# Patient Record
Sex: Female | Born: 2000 | Race: Black or African American | Hispanic: No | Marital: Single | Smoking: Never smoker
Health system: Southern US, Community
[De-identification: ages and names within clinical notes are randomized; demographics above are authoritative.]

## PROBLEM LIST (undated history)

## (undated) DIAGNOSIS — F419 Anxiety disorder, unspecified: Secondary | ICD-10-CM

---
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
## 2021-02-08 ENCOUNTER — Emergency Department (HOSPITAL_COMMUNITY)
Admission: EM | Admit: 2021-02-08 | Discharge: 2021-02-09 | Disposition: A | Discharge status: Home / Self Care | Payer: Medicaid Other | Attending: Emergency Medicine | Admitting: Emergency Medicine

## 2021-02-08 ENCOUNTER — Other Ambulatory Visit: Payer: Self-pay

## 2021-02-08 ENCOUNTER — Encounter (HOSPITAL_COMMUNITY): Payer: Self-pay

## 2021-02-08 ENCOUNTER — Emergency Department (HOSPITAL_COMMUNITY): Payer: Medicaid Other

## 2021-02-08 DIAGNOSIS — M542 Cervicalgia: Secondary | ICD-10-CM | POA: Insufficient documentation

## 2021-02-08 DIAGNOSIS — S20312A Abrasion of left front wall of thorax, initial encounter: Secondary | ICD-10-CM | POA: Insufficient documentation

## 2021-02-08 DIAGNOSIS — Z23 Encounter for immunization: Secondary | ICD-10-CM | POA: Insufficient documentation

## 2021-02-08 DIAGNOSIS — Y92410 Unspecified street and highway as the place of occurrence of the external cause: Secondary | ICD-10-CM | POA: Insufficient documentation

## 2021-02-08 DIAGNOSIS — S299XXA Unspecified injury of thorax, initial encounter: Secondary | ICD-10-CM | POA: Diagnosis present
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
## 2021-02-09 ENCOUNTER — Emergency Department (HOSPITAL_COMMUNITY): Payer: Medicaid Other

## 2021-02-09 ENCOUNTER — Encounter (HOSPITAL_COMMUNITY): Payer: Self-pay

## 2021-02-09 LAB — BASIC METABOLIC PANEL
Anion gap: 8 (ref 5–15)
BUN: 9 mg/dL (ref 6–20)
CO2: 24 mmol/L (ref 22–32)
Calcium: 9.6 mg/dL (ref 8.9–10.3)
Chloride: 107 mmol/L (ref 98–111)
Creatinine, Ser: 0.45 mg/dL (ref 0.44–1.00)
GFR, Estimated: >60 mL/min (ref >60)
Glucose, Bld: 95 mg/dL (ref 70–99)
Potassium: 3.7 mmol/L (ref 3.5–5.1)
Sodium: 139 mmol/L (ref 135–145)

## 2021-02-09 LAB — CBC
HCT: 37.2 % (ref 36.0–46.0)
Hemoglobin: 12.4 g/dL (ref 12.0–15.0)
MCH: 30 pg (ref 26.0–34.0)
MCHC: 33.3 g/dL (ref 30.0–36.0)
MCV: 89.9 fL (ref 80.0–100.0)
Platelets: 327 10*3/uL (ref 150–400)
RBC: 4.14 MIL/uL (ref 3.87–5.11)
RDW: 12.2 % (ref 11.5–15.5)
WBC: 8.8 10*3/uL (ref 4.0–10.5)
nRBC: 0 % (ref 0.0–0.2)

## 2021-02-09 LAB — I-STAT BETA HCG BLOOD, ED (MC, WL, AP ONLY): I-stat hCG, quantitative: <5 m[IU]/mL (ref <5)

## 2021-02-09 MED ORDER — OXYCODONE-ACETAMINOPHEN 5-325 MG PO TABS
1.0000 | ORAL_TABLET | Freq: Once | ORAL | Status: AC
  Administered 2021-02-09: 1 via ORAL
  Filled 2021-02-09: qty 1

## 2021-02-09 MED ORDER — METHOCARBAMOL 500 MG PO TABS: 500.0000 mg | ORAL_TABLET | Freq: Two times a day (BID) | ORAL | 0 refills | Status: DC

## 2021-02-09 MED ORDER — IOHEXOL 350 MG/ML SOLN
60.0000 mL | Freq: Once | INTRAVENOUS | Status: AC | PRN
  Administered 2021-02-09: 60 mL via INTRAVENOUS

## 2021-02-09 MED ORDER — TETANUS-DIPHTH-ACELL PERTUSSIS 5-2.5-18.5 LF-MCG/0.5 IM SUSY
0.5000 mL | PREFILLED_SYRINGE | Freq: Once | INTRAMUSCULAR | Status: AC
  Administered 2021-02-09: 0.5 mL via INTRAMUSCULAR
  Filled 2021-02-09: qty 0.5
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
## 2021-04-21 ENCOUNTER — Emergency Department (HOSPITAL_COMMUNITY): Payer: Medicaid Other

## 2021-04-21 ENCOUNTER — Other Ambulatory Visit: Payer: Self-pay

## 2021-04-21 ENCOUNTER — Encounter (HOSPITAL_COMMUNITY): Payer: Self-pay | Admitting: Emergency Medicine

## 2021-04-21 ENCOUNTER — Emergency Department (HOSPITAL_COMMUNITY)
Admission: EM | Admit: 2021-04-21 | Discharge: 2021-04-22 | Disposition: A | Discharge status: Home / Self Care | Payer: Medicaid Other | Attending: Emergency Medicine | Admitting: Emergency Medicine

## 2021-04-21 DIAGNOSIS — R0789 Other chest pain: Secondary | ICD-10-CM | POA: Insufficient documentation

## 2021-04-21 DIAGNOSIS — R0602 Shortness of breath: Secondary | ICD-10-CM | POA: Diagnosis not present

## 2021-04-21 DIAGNOSIS — R079 Chest pain, unspecified: Secondary | ICD-10-CM

## 2021-04-21 LAB — CBC WITH DIFFERENTIAL/PLATELET
Abs Immature Granulocytes: 0.01 10*3/uL (ref 0.00–0.07)
Basophils Absolute: 0 10*3/uL (ref 0.0–0.1)
Basophils Relative: 0 %
Eosinophils Absolute: 0.1 10*3/uL (ref 0.0–0.5)
Eosinophils Relative: 2 %
HCT: 37.8 % (ref 36.0–46.0)
Hemoglobin: 12.5 g/dL (ref 12.0–15.0)
Immature Granulocytes: 0 %
Lymphocytes Relative: 40 %
Lymphs Abs: 2.4 10*3/uL (ref 0.7–4.0)
MCH: 30 pg (ref 26.0–34.0)
MCHC: 33.1 g/dL (ref 30.0–36.0)
MCV: 90.9 fL (ref 80.0–100.0)
Monocytes Absolute: 0.5 10*3/uL (ref 0.1–1.0)
Monocytes Relative: 8 %
Neutro Abs: 2.9 10*3/uL (ref 1.7–7.7)
Neutrophils Relative %: 50 %
Platelets: 296 10*3/uL (ref 150–400)
RBC: 4.16 MIL/uL (ref 3.87–5.11)
RDW: 12 % (ref 11.5–15.5)
WBC: 5.9 10*3/uL (ref 4.0–10.5)
nRBC: 0 % (ref 0.0–0.2)

## 2021-04-21 LAB — I-STAT BETA HCG BLOOD, ED (MC, WL, AP ONLY): I-stat hCG, quantitative: <5 m[IU]/mL (ref <5)

## 2021-04-21 LAB — TROPONIN I (HIGH SENSITIVITY): Troponin I (High Sensitivity): <2 ng/L (ref <18)

## 2021-04-21 LAB — BASIC METABOLIC PANEL
Anion gap: 9 (ref 5–15)
BUN: 7 mg/dL (ref 6–20)
CO2: 24 mmol/L (ref 22–32)
Calcium: 9.5 mg/dL (ref 8.9–10.3)
Chloride: 104 mmol/L (ref 98–111)
Creatinine, Ser: 0.59 mg/dL (ref 0.44–1.00)
GFR, Estimated: >60 mL/min (ref >60)
Glucose, Bld: 93 mg/dL (ref 70–99)
Potassium: 4.2 mmol/L (ref 3.5–5.1)
Sodium: 137 mmol/L (ref 135–145)

## 2021-04-21 LAB — D-DIMER, QUANTITATIVE: D-Dimer, Quant: <0.27 ug/mL-FEU (ref 0.00–0.50)

## 2021-04-21 NOTE — ED Provider Notes (Signed)
Emergency Medicine Provider Triage Evaluation Note  Caitlyn Hancock , a 20 y.o. female  was evaluated in triage.  Pt complains of sudden onset, intermittent, left-sided chest pain for the past 3 to 4 days.  Patient reports associated shortness of breath when the chest pain comes on.  It would last several hours before dissipating.  It is sharp in nature.  She denies any other associated symptoms.  Denies any fevers, coughs, body aches recently.  She denies history of DVT or PE.  No recent prolonged travel or immobilization.  No exogenous hormone use.  No active malignancy.  No hemoptysis.  Review of Systems  Positive: + chest pain, SOB Negative: - fevers, cough, nausea, vomiting  Physical Exam  BP (!) 141/93 (BP Location: Right Arm)   Pulse (!) 106   Temp 99 F (37.2 C) (Oral)   Resp 18   SpO2 100%  Gen:   Awake, no distress   Resp:  Normal effort  MSK:   Moves extremities without difficulty  Other:  RRR  Medical Decision Making  Medically screening exam initiated at 8:38 PM.  Appropriate orders placed.  Caitlyn Hancock was informed that the remainder of the evaluation will be completed by another provider, this initial triage assessment does not replace that evaluation, and the importance of remaining in the ED until their evaluation is complete.     Tanda Rockers, PA-C 04/21/21 2040    Ernie Avena, MD 04/21/21 2050

## 2021-04-21 NOTE — ED Triage Notes (Signed)
Pt c/o intermittent left sided chest pain and shortness of breath x 2 days. Denies fever/cough.

## 2021-04-22 LAB — TROPONIN I (HIGH SENSITIVITY): Troponin I (High Sensitivity): <2 ng/L (ref <18)

## 2021-04-22 MED ORDER — ONDANSETRON 4 MG PO TBDP: 4.0000 mg | ORAL_TABLET | Freq: Three times a day (TID) | ORAL | 0 refills | Status: DC | PRN

## 2021-04-22 NOTE — Discharge Instructions (Addendum)
As we discussed your workup today did not show any signs of damage to your heart or lungs, and blood clots or other explanation for your chest pain.  As we discussed there is a large differential for things that can cause chest pain not related to the heart or lungs including acid reflux, anxiety, stress, musculoskeletal pain.  Recommend ibuprofen, Tylenol as needed for chest pain.  If it worsens or fails to improve I do recommend that you follow-up with her primary care doctor and establish care.  If chest pain shortness of breath significantly changes or becomes unbearable please return to the emergency department for further evaluation

## 2021-04-22 NOTE — ED Provider Notes (Signed)
[facility] EMERGENCY DEPARTMENT Provider Note   CSN: [id number] Arrival date & time: 04/21/21  2032     History Chief Complaint  Patient presents with   Chest Pain    Caitlyn Hancock is a 20 y.o. female With no significant past medical history who presents with 3 to 4 days of sudden onset, intermittent left-sided chest pain.  Patient reports some associated shortness of breath.  Patient reports that chest pain lasts for a few hours before dissipating.  Patient reports chest pain is sharp, not crushing.  Patient denies any fever, cough, body aches.  No history of DVT or PE.  No recent prolonged travel.  No use of estrogen or other hormones.  Patient denies history of cancer or hemoptysis.   Chest Pain Associated symptoms: shortness of breath       History reviewed. No pertinent past medical history.  There are no problems to display for this patient.   History reviewed. No pertinent surgical history.   OB History   No obstetric history on file.     No family history on file.     Home Medications Prior to Admission medications   Medication Sig Start Date End Date Taking? Authorizing Provider  ondansetron (ZOFRAN ODT) 4 MG disintegrating tablet Take 1 tablet (4 mg total) by mouth every 8 (eight) hours as needed for nausea or vomiting. 04/22/21  Yes Loel Betancur H, PA-C  methocarbamol (ROBAXIN) 500 MG tablet Take 1 tablet (500 mg total) by mouth 2 (two) times daily. 02/09/21   McDonald, Mia A, PA-C    Allergies    Patient has no known allergies.  Review of Systems   Review of Systems  Respiratory:  Positive for shortness of breath.   Cardiovascular:  Positive for chest pain.  All other systems reviewed and are negative.  Physical Exam Updated Vital Signs BP 123/76   Pulse 91   Temp 98.4 F (36.9 C) (Oral)   Resp 14   SpO2 100%   Physical Exam Vitals and nursing note reviewed.  Constitutional:      General: She is not in acute  distress.    Appearance: Normal appearance.  HENT:     Head: Normocephalic and atraumatic.  Eyes:     General:        Right eye: No discharge.        Left eye: No discharge.  Cardiovascular:     Rate and Rhythm: Normal rate and regular rhythm.     Heart sounds: No murmur heard.   No friction rub. No gallop.     Comments: Initial vitals show patient with tachycardia, however no tachycardia on my evaluation.  No tenderness to palpation chest wall, or upper abdomen. Pulmonary:     Effort: Pulmonary effort is normal.     Breath sounds: Normal breath sounds.  Abdominal:     General: Bowel sounds are normal.     Palpations: Abdomen is soft.  Skin:    General: Skin is warm and dry.     Capillary Refill: Capillary refill takes less than 2 seconds.  Neurological:     Mental Status: She is alert and oriented to person, place, and time.  Psychiatric:        Mood and Affect: Mood normal.        Behavior: Behavior normal.    ED Results / Procedures / Treatments   Labs (all labs ordered are listed, but only abnormal results are displayed) Labs Reviewed  BASIC METABOLIC  PANEL  CBC WITH DIFFERENTIAL/PLATELET  D-DIMER, QUANTITATIVE  I-STAT BETA HCG BLOOD, ED ([facility], [facility], [facility] ONLY)  TROPONIN I (HIGH SENSITIVITY)  TROPONIN I (HIGH SENSITIVITY)    EKG None  Radiology DG Chest 2 View  Result Date: 04/21/2021 CLINICAL DATA:  Chest pain. EXAM: CHEST - 2 VIEW COMPARISON:  Radiograph and CT 02/09/2021 FINDINGS: The cardiomediastinal contours are normal. The lungs are clear. Pulmonary vasculature is normal. No consolidation, pleural effusion, or pneumothorax. No acute osseous abnormalities are seen. IMPRESSION: Negative radiographs of the chest. Electronically Signed   By: Narda Rutherford M.D.   On: 04/21/2021 21:48    Procedures Procedures   Medications Ordered in ED Medications - No data to display  ED Course  I have reviewed the triage vital signs and the nursing notes.  Pertinent  labs & imaging results that were available during my care of the patient were reviewed by me and considered in my medical decision making (see chart for details).    MDM Rules/Calculators/A&P                         Given the large differential diagnosis for [facility], the decision making in this case is of high complexity.  After evaluating all of the data points in this case, the presentation of Caitlyn Hancock is NOT consistent with Acute Coronary Syndrome (ACS) and/or myocardial ischemia, pulmonary embolism, aortic dissection; Borhaave's, significant arrythmia, pneumothorax, cardiac tamponade, or other emergent cardiopulmonary condition.  Further, the presentation of Caitlyn Hancock is NOT consistent with pericarditis, myocarditis, cholecystitis, pancreatitis, mediastinitis, endocarditis, new valvular disease.  Additionally, the presentation of Caitlyn Hancock NOT consistent with flail chest, cardiac contusion, ARDS, or significant intra-thoracic or intra-abdominal bleeding.  Moreover, this presentation is NOT consistent with pneumonia, sepsis, or pyelonephritis.   Discussed with patient there is broad differential for chest pain that is not cardiac, pulmonary nature including acid reflux, anxiety, musculoskeletal pain.  Patient does not have any significant risk factors to point me in 1 direction or another, however her work-up today was reassuring, including negative DVT, chest x-ray, EKG, troponin x2.  Patient has a heart score of 1, no significant risk factors.  Do not believe that she needs urgent follow-up with cardiology at this time.  Patient remains in stable condition at this time, and is ready for discharge.  Strict return and follow-up precautions have been given by me personally or by detailed written instruction given verbally by nursing staff using the teach back method to the patient/family/caregiver(s).  Data Reviewed/Counseling: I have reviewed the patient's  vital signs, nursing notes, and other relevant tests/information. I had a detailed discussion regarding the historical points, exam findings, and any diagnostic results supporting the discharge diagnosis. I also discussed the need for outpatient follow-up and the need to return to the ED if symptoms worsen or if there are any questions or concerns that arise at home.  Final Clinical Impression(s) / ED Diagnoses Final diagnoses:  Chest pain, unspecified type    Rx / DC Orders ED Discharge Orders          Ordered    ondansetron (ZOFRAN ODT) 4 MG disintegrating tablet  Every 8 hours PRN        04/22/21 1047             Nikolaj Geraghty, [place] H, PA-C 04/22/21 1643    Arby Barrette, MD 04/27/21 [phone]

## 2021-04-24 ENCOUNTER — Emergency Department (HOSPITAL_COMMUNITY)
Admission: EM | Admit: 2021-04-24 | Discharge: 2021-04-25 | Disposition: A | Discharge status: Home / Self Care | Payer: Medicaid Other | Attending: Emergency Medicine | Admitting: Emergency Medicine

## 2021-04-24 DIAGNOSIS — Z20822 Contact with and (suspected) exposure to covid-19: Secondary | ICD-10-CM | POA: Diagnosis not present

## 2021-04-24 DIAGNOSIS — N9489 Other specified conditions associated with female genital organs and menstrual cycle: Secondary | ICD-10-CM | POA: Diagnosis not present

## 2021-04-24 DIAGNOSIS — R079 Chest pain, unspecified: Secondary | ICD-10-CM | POA: Insufficient documentation

## 2021-04-24 DIAGNOSIS — Z5321 Procedure and treatment not carried out due to patient leaving prior to being seen by health care provider: Secondary | ICD-10-CM | POA: Insufficient documentation

## 2021-04-24 DIAGNOSIS — R0602 Shortness of breath: Secondary | ICD-10-CM | POA: Insufficient documentation
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
## 2021-04-25 ENCOUNTER — Emergency Department (HOSPITAL_COMMUNITY): Payer: Medicaid Other

## 2021-04-25 ENCOUNTER — Other Ambulatory Visit: Payer: Self-pay

## 2021-04-25 ENCOUNTER — Encounter (HOSPITAL_COMMUNITY): Payer: Self-pay | Admitting: Emergency Medicine

## 2021-04-25 LAB — COMPREHENSIVE METABOLIC PANEL
ALT: 9 U/L (ref 0–44)
AST: 15 U/L (ref 15–41)
Albumin: 4.2 g/dL (ref 3.5–5.0)
Alkaline Phosphatase: 51 U/L (ref 38–126)
Anion gap: 8 (ref 5–15)
BUN: 5 mg/dL — ABNORMAL LOW (ref 6–20)
CO2: 24 mmol/L (ref 22–32)
Calcium: 9.3 mg/dL (ref 8.9–10.3)
Chloride: 104 mmol/L (ref 98–111)
Creatinine, Ser: 0.58 mg/dL (ref 0.44–1.00)
GFR, Estimated: >60 mL/min (ref >60)
Glucose, Bld: 95 mg/dL (ref 70–99)
Potassium: 3.4 mmol/L — ABNORMAL LOW (ref 3.5–5.1)
Sodium: 136 mmol/L (ref 135–145)
Total Bilirubin: 0.7 mg/dL (ref 0.3–1.2)
Total Protein: 7.2 g/dL (ref 6.5–8.1)

## 2021-04-25 LAB — CBC
HCT: 36 % (ref 36.0–46.0)
Hemoglobin: 11.7 g/dL — ABNORMAL LOW (ref 12.0–15.0)
MCH: 29.8 pg (ref 26.0–34.0)
MCHC: 32.5 g/dL (ref 30.0–36.0)
MCV: 91.6 fL (ref 80.0–100.0)
Platelets: 275 10*3/uL (ref 150–400)
RBC: 3.93 MIL/uL (ref 3.87–5.11)
RDW: 11.9 % (ref 11.5–15.5)
WBC: 6.6 10*3/uL (ref 4.0–10.5)
nRBC: 0 % (ref 0.0–0.2)

## 2021-04-25 LAB — TROPONIN I (HIGH SENSITIVITY)
Troponin I (High Sensitivity): <2 ng/L (ref <18)
Troponin I (High Sensitivity): <2 ng/L (ref <18)

## 2021-04-25 LAB — RESP PANEL BY RT-PCR (FLU A&B, COVID) ARPGX2
Influenza A by PCR: NEGATIVE
Influenza B by PCR: NEGATIVE
SARS Coronavirus 2 by RT PCR: NEGATIVE

## 2021-04-25 LAB — I-STAT BETA HCG BLOOD, ED (MC, WL, AP ONLY): I-stat hCG, quantitative: <5 m[IU]/mL (ref <5)

## 2021-04-25 MED ORDER — ALBUTEROL SULFATE HFA 108 (90 BASE) MCG/ACT IN AERS
2.0000 | INHALATION_SPRAY | Freq: Once | RESPIRATORY_TRACT | Status: AC
  Administered 2021-04-25: 2 via RESPIRATORY_TRACT
  Filled 2021-04-25: qty 6.7

## 2021-04-25 MED ORDER — ACETAMINOPHEN 325 MG PO TABS
650.0000 mg | ORAL_TABLET | Freq: Once | ORAL | Status: AC
  Administered 2021-04-25: 650 mg via ORAL
  Filled 2021-04-25: qty 2

## 2021-04-25 NOTE — ED Notes (Signed)
Pt no call for room x2

## 2021-04-25 NOTE — ED Provider Notes (Signed)
Emergency Medicine Provider Triage Evaluation Note  Caitlyn Hancock , a 20 y.o. female  was evaluated in triage.  Pt complains of chest pain since 04/19/21. Initially intermittent, now constant and worsening since last visit. Associated chills, dyspnea, nausea, and mild cough.   Denies leg pain/swelling, hemoptysis, recent surgery/trauma, recent long travel, hormone use, personal hx of cancer, or hx of DVT/PE.   Review of Systems  Positive: Chest pain, dyspnea, cough, nausea, chills Negative: Hemoptysis, vomiting, abdominal pain.   Physical Exam  BP 116/74 (BP Location: Right Arm)   Pulse 83   Temp 98.9 F (37.2 C) (Oral)   Resp 15   SpO2 98%  Gen:   Awake, no distress   Resp:  Normal effort  MSK:   Moves extremities without difficulty   Medical Decision Making  Medically screening exam initiated at 12:01 AM.  Appropriate orders placed.  Caitlyn Hancock was informed that the remainder of the evaluation will be completed by another provider, this initial triage assessment does not replace that evaluation, and the importance of remaining in the ED until their evaluation is complete.  Recent visit for same- had negative D-dimer   Chest pain.    Desmond Lope 04/25/21 0009    Palumbo, April, MD 04/25/21 0013

## 2021-04-25 NOTE — ED Notes (Signed)
Patient states her chest is tighter and feels like her oxygen is low. Oxygen 100% RA HR 110.

## 2021-06-02 ENCOUNTER — Other Ambulatory Visit: Payer: Self-pay

## 2021-06-02 ENCOUNTER — Emergency Department (HOSPITAL_BASED_OUTPATIENT_CLINIC_OR_DEPARTMENT_OTHER): Payer: Medicaid Other

## 2021-06-02 ENCOUNTER — Emergency Department (HOSPITAL_BASED_OUTPATIENT_CLINIC_OR_DEPARTMENT_OTHER)
Admission: EM | Admit: 2021-06-02 | Discharge: 2021-06-02 | Disposition: A | Discharge status: Home / Self Care | Payer: Medicaid Other | Attending: Emergency Medicine | Admitting: Emergency Medicine

## 2021-06-02 ENCOUNTER — Encounter (HOSPITAL_BASED_OUTPATIENT_CLINIC_OR_DEPARTMENT_OTHER): Payer: Self-pay | Admitting: *Deleted

## 2021-06-02 DIAGNOSIS — R1033 Periumbilical pain: Secondary | ICD-10-CM | POA: Diagnosis not present

## 2021-06-02 DIAGNOSIS — R0789 Other chest pain: Secondary | ICD-10-CM | POA: Diagnosis present

## 2021-06-02 DIAGNOSIS — R079 Chest pain, unspecified: Secondary | ICD-10-CM

## 2021-06-02 DIAGNOSIS — R000 Tachycardia, unspecified: Secondary | ICD-10-CM

## 2021-06-02 DIAGNOSIS — R0602 Shortness of breath: Secondary | ICD-10-CM | POA: Insufficient documentation

## 2021-06-02 DIAGNOSIS — R109 Unspecified abdominal pain: Secondary | ICD-10-CM

## 2021-06-02 HISTORY — DX: Anxiety disorder, unspecified: F41.9

## 2021-06-02 LAB — COMPREHENSIVE METABOLIC PANEL
ALT: 10 U/L (ref 0–44)
AST: 18 U/L (ref 15–41)
Albumin: 4.9 g/dL (ref 3.5–5.0)
Alkaline Phosphatase: 55 U/L (ref 38–126)
Anion gap: 10 (ref 5–15)
BUN: 8 mg/dL (ref 6–20)
CO2: 23 mmol/L (ref 22–32)
Calcium: 9.1 mg/dL (ref 8.9–10.3)
Chloride: 102 mmol/L (ref 98–111)
Creatinine, Ser: 0.55 mg/dL (ref 0.44–1.00)
GFR, Estimated: >60 mL/min (ref >60)
Glucose, Bld: 85 mg/dL (ref 70–99)
Potassium: 3.3 mmol/L — ABNORMAL LOW (ref 3.5–5.1)
Sodium: 135 mmol/L (ref 135–145)
Total Bilirubin: 0.8 mg/dL (ref 0.3–1.2)
Total Protein: 8.8 g/dL — ABNORMAL HIGH (ref 6.5–8.1)

## 2021-06-02 LAB — DIFFERENTIAL
Abs Immature Granulocytes: 0.02 10*3/uL (ref 0.00–0.07)
Basophils Absolute: 0 10*3/uL (ref 0.0–0.1)
Basophils Relative: 1 %
Eosinophils Absolute: 0.1 10*3/uL (ref 0.0–0.5)
Eosinophils Relative: 2 %
Immature Granulocytes: 0 %
Lymphocytes Relative: 33 %
Lymphs Abs: 2.1 10*3/uL (ref 0.7–4.0)
Monocytes Absolute: 0.4 10*3/uL (ref 0.1–1.0)
Monocytes Relative: 7 %
Neutro Abs: 3.7 10*3/uL (ref 1.7–7.7)
Neutrophils Relative %: 57 %

## 2021-06-02 LAB — D-DIMER, QUANTITATIVE: D-Dimer, Quant: <0.27 ug/mL-FEU (ref 0.00–0.50)

## 2021-06-02 LAB — CBC
HCT: 39.8 % (ref 36.0–46.0)
Hemoglobin: 13.5 g/dL (ref 12.0–15.0)
MCH: 30.1 pg (ref 26.0–34.0)
MCHC: 33.9 g/dL (ref 30.0–36.0)
MCV: 88.8 fL (ref 80.0–100.0)
Platelets: 274 10*3/uL (ref 150–400)
RBC: 4.48 MIL/uL (ref 3.87–5.11)
RDW: 12.2 % (ref 11.5–15.5)
WBC: 6.3 10*3/uL (ref 4.0–10.5)
nRBC: 0 % (ref 0.0–0.2)

## 2021-06-02 LAB — LIPASE, BLOOD: Lipase: 28 U/L (ref 11–51)

## 2021-06-02 LAB — TROPONIN I (HIGH SENSITIVITY): Troponin I (High Sensitivity): <2 ng/L (ref <18)

## 2021-06-02 MED ORDER — SODIUM CHLORIDE 0.9 % IV BOLUS
1000.0000 mL | Freq: Once | INTRAVENOUS | Status: AC
  Administered 2021-06-02: 19:00:00 1000 mL via INTRAVENOUS

## 2021-06-02 MED ORDER — IOHEXOL 300 MG/ML  SOLN
75.0000 mL | Freq: Once | INTRAMUSCULAR | Status: AC | PRN
  Administered 2021-06-02: 18:00:00 75 mL via INTRAVENOUS

## 2021-06-02 NOTE — ED Notes (Signed)
Attempted IV x 1; unable to get all labs; pt sts she would like a break IV attempts

## 2021-06-02 NOTE — ED Triage Notes (Signed)
She was seen at [facility] today for abdominal pain. 6 weeks of chest pain and sob. She has been seen numerous times over 2 months for same without a cause found.

## 2021-06-02 NOTE — ED Notes (Signed)
Patient discharged to home.  All discharge instructions reviewed.  Patient verbalized understanding via teachback method.  VS WDL.  Respirations even and unlabored.  Ambulatory out of ED.   °

## 2021-06-02 NOTE — ED Provider Notes (Signed)
[facility] EMERGENCY DEPARTMENT Provider Note   CSN: [id number] Arrival date & time: 06/02/21  1547     History Chief Complaint  Patient presents with   Chest Pain    Caitlyn Hancock is a 20 y.o. female history of anxiety here presenting with chest pain.  Patient has been having intermittent chest pain and shortness of breath for the last 6 weeks.  Patient was seen previously for similar symptoms and has cardiology follow-up next month.  However she states that her shortness of breath has been getting worse.  She states that it is worse when she takes a deep breath. Patient also has been having abdominal pain for the last 2 weeks.  She states that it is periumbilical and has no radiation.  Has no vomiting or fevers.  Patient went to urgent care was sent in for evaluation.  Patient has no known history of CAD or stents.  Patient has no recent travel.  The history is provided by the patient.      Past Medical History:  Diagnosis Date   Anxiety     There are no problems to display for this patient.   History reviewed. No pertinent surgical history.   OB History   No obstetric history on file.     No family history on file.  Social History   Tobacco Use   Smoking status: Never   Smokeless tobacco: Never  Vaping Use   Vaping Use: Never used  Substance Use Topics   Alcohol use: Never   Drug use: Never    Home Medications Prior to Admission medications   Medication Sig Start Date End Date Taking? Authorizing Provider  methocarbamol (ROBAXIN) 500 MG tablet Take 1 tablet (500 mg total) by mouth 2 (two) times daily. 02/09/21   McDonald, Mia A, PA-C  ondansetron (ZOFRAN ODT) 4 MG disintegrating tablet Take 1 tablet (4 mg total) by mouth every 8 (eight) hours as needed for nausea or vomiting. 04/22/21   Prosperi, Christian H, PA-C    Allergies    Patient has no known allergies.  Review of Systems   Review of Systems  Cardiovascular:  Positive for chest  pain.  All other systems reviewed and are negative.  Physical Exam Updated Vital Signs BP 114/75    Pulse 97    Temp 98 F (36.7 C) (Oral)    Resp 15    Ht 5\' 4"  (1.626 m)    Wt 54.4 kg    LMP 06/01/2021    SpO2 98%    BMI 20.59 kg/m   Physical Exam Vitals and nursing note reviewed.  Constitutional:      Appearance: She is well-developed.  HENT:     Head: Normocephalic.  Eyes:     Extraocular Movements: Extraocular movements intact.     Pupils: Pupils are equal, round, and reactive to light.  Cardiovascular:     Rate and Rhythm: Normal rate and regular rhythm.     Heart sounds: Normal heart sounds.  Pulmonary:     Effort: Pulmonary effort is normal.     Breath sounds: Normal breath sounds.  Abdominal:     General: Bowel sounds are normal.     Palpations: Abdomen is soft.     Comments: Mild periumbilical tenderness.  No rebound or guarding  Musculoskeletal:        General: Normal range of motion.     Cervical back: Normal range of motion and neck supple.  Skin:    General: Skin  is warm.  Neurological:     General: No focal deficit present.     Mental Status: She is alert and oriented to person, place, and time.  Psychiatric:        Mood and Affect: Mood normal.        Behavior: Behavior normal.    ED Results / Procedures / Treatments   Labs (all labs ordered are listed, but only abnormal results are displayed) Labs Reviewed  COMPREHENSIVE METABOLIC PANEL - Abnormal; Notable for the following components:      Result Value   Potassium 3.3 (*)    Total Protein 8.8 (*)    All other components within normal limits  D-DIMER, QUANTITATIVE  LIPASE, BLOOD  CBC  DIFFERENTIAL  CBC WITH DIFFERENTIAL/PLATELET  URINALYSIS, ROUTINE W REFLEX MICROSCOPIC  TROPONIN I (HIGH SENSITIVITY)  TROPONIN I (HIGH SENSITIVITY)    EKG EKG Interpretation  Date/Time:  Friday June 02 2021 17:57:10 EST Ventricular Rate:  103 PR Interval:  145 QRS Duration: 100 QT  Interval:  346 QTC Calculation: 453 R Axis:   80 Text Interpretation: Sinus tachycardia Atrial premature complex RSR' in V1 or V2, right VCD or RVH No significant change since last tracing Confirmed by Richardean Canal [phone]) on 06/02/2021 7:03:34 PM  Radiology DG Chest 2 View  Result Date: 06/02/2021 CLINICAL DATA:  Chest pain x6 weeks EXAM: CHEST - 2 VIEW COMPARISON:  April 25, 2021. FINDINGS: The heart size and mediastinal contours are within normal limits. No focal consolidation. No pleural effusion. No pneumothorax. The visualized skeletal structures are unremarkable. IMPRESSION: No active cardiopulmonary disease. Electronically Signed   By: Maudry Mayhew M.D.   On: 06/02/2021 17:02   CT ABDOMEN PELVIS W CONTRAST  Result Date: 06/02/2021 CLINICAL DATA:  Abdominal pain. EXAM: CT ABDOMEN AND PELVIS WITH CONTRAST TECHNIQUE: Multidetector CT imaging of the abdomen and pelvis was performed using the standard protocol following bolus administration of intravenous contrast. CONTRAST:  26mL OMNIPAQUE IOHEXOL 300 MG/ML  SOLN COMPARISON:  None. FINDINGS: Lower chest: No acute abnormality. Hepatobiliary: No focal liver abnormality is seen. No gallstones, gallbladder wall thickening, or biliary dilatation. Pancreas: Unremarkable. No pancreatic ductal dilatation or surrounding inflammatory changes. Spleen: Normal in size without focal abnormality. Adrenals/Urinary Tract: Adrenal glands are unremarkable. Kidneys are normal, without renal calculi, focal lesion, or hydronephrosis. Bladder is unremarkable. Stomach/Bowel: Stomach is within normal limits. Appendix appears normal. No evidence of bowel wall thickening, distention, or inflammatory changes. Vascular/Lymphatic: No significant vascular findings are present. No enlarged abdominal or pelvic lymph nodes. Reproductive: Uterus and bilateral adnexa are unremarkable. Other: No abdominal wall hernia or abnormality. No abdominopelvic ascites. Musculoskeletal: No  acute or significant osseous findings. IMPRESSION: No acute intra-abdominal findings. Electronically Signed   By: Aram Candela M.D.   On: 06/02/2021 18:44    Procedures Procedures   Medications Ordered in ED Medications  sodium chloride 0.9 % bolus 1,000 mL (1,000 mLs Intravenous New Bag/Given 06/02/21 1837)  iohexol (OMNIPAQUE) 300 MG/ML solution 75 mL (75 mLs Intravenous Contrast Given 06/02/21 1821)    ED Course  I have reviewed the triage vital signs and the nursing notes.  Pertinent labs & imaging results that were available during my care of the patient were reviewed by me and considered in my medical decision making (see chart for details).    MDM Rules/Calculators/A&P                         Caitlyn Hancock is  a 20 y.o. female here with abdominal pain and chest pain and shortness of breath.  Chest pain and shortness for about 6 weeks and abdominal pain for about 2 weeks.  I think likely related to anxiety.  We will get CBC and CMP and troponin and D-dimer and CT abdomen pelvis to rule out organic cause.   7:07 PM Labs unremarkable.  D-dimer negative.  Troponin negative x1.  CT abdomen pelvis unremarkable.  Patient's heart rate goes from mid to upper 90s to 110.  There is no obvious A. fib on the monitor.  She has cardiology follow-up in 2 weeks.  I told her that I do recommend Holter monitor when she see cardiology.  Stable for discharge     Final Clinical Impression(s) / ED Diagnoses Final diagnoses:  None    Rx / DC Orders ED Discharge Orders     None        Charlynne Pander, MD 06/02/21 [place] [facility]

## 2021-06-02 NOTE — Discharge Instructions (Signed)
Please see cardiology as scheduled in January.  I do recommend a Holter monitor since your heart rate does go above 100  Return to ER if you have worse abdominal pain, palpitations, chest pain, trouble breathing

## 2021-06-15 DIAGNOSIS — F419 Anxiety disorder, unspecified: Secondary | ICD-10-CM | POA: Insufficient documentation

## 2021-06-29 ENCOUNTER — Other Ambulatory Visit: Payer: Self-pay | Admitting: Emergency Medicine

## 2021-06-29 DIAGNOSIS — R1012 Left upper quadrant pain: Secondary | ICD-10-CM

## 2021-07-11 ENCOUNTER — Other Ambulatory Visit: Payer: Self-pay

## 2021-07-11 ENCOUNTER — Ambulatory Visit
Admission: RE | Admit: 2021-07-11 | Discharge: 2021-07-11 | Disposition: A | Discharge status: Home / Self Care | Payer: Medicaid Other | Source: Ambulatory Visit | Attending: Emergency Medicine | Admitting: Emergency Medicine

## 2021-07-11 DIAGNOSIS — R1012 Left upper quadrant pain: Secondary | ICD-10-CM

## 2021-12-08 ENCOUNTER — Encounter: Payer: Medicaid Other | Admitting: Obstetrics and Gynecology

## 2022-01-05 ENCOUNTER — Ambulatory Visit (INDEPENDENT_AMBULATORY_CARE_PROVIDER_SITE_OTHER): Payer: Medicaid Other | Admitting: Family Medicine

## 2022-01-05 ENCOUNTER — Other Ambulatory Visit (HOSPITAL_COMMUNITY)
Admission: RE | Admit: 2022-01-05 | Discharge: 2022-01-05 | Disposition: A | Discharge status: Home / Self Care | Payer: Medicaid Other | Source: Ambulatory Visit | Attending: Obstetrics and Gynecology | Admitting: Obstetrics and Gynecology

## 2022-01-05 ENCOUNTER — Other Ambulatory Visit: Payer: Self-pay

## 2022-01-05 ENCOUNTER — Encounter: Payer: Self-pay | Admitting: Family Medicine

## 2022-01-05 VITALS — Ht 64.0 in | Wt 123.0 lb

## 2022-01-05 DIAGNOSIS — Z113 Encounter for screening for infections with a predominantly sexual mode of transmission: Secondary | ICD-10-CM

## 2022-01-05 DIAGNOSIS — Z124 Encounter for screening for malignant neoplasm of cervix: Secondary | ICD-10-CM | POA: Diagnosis not present

## 2022-01-05 DIAGNOSIS — Z01411 Encounter for gynecological examination (general) (routine) with abnormal findings: Secondary | ICD-10-CM

## 2022-01-05 DIAGNOSIS — Z01419 Encounter for gynecological examination (general) (routine) without abnormal findings: Secondary | ICD-10-CM

## 2022-01-05 NOTE — Patient Instructions (Signed)
Contraception Choices Contraception, also called birth control, refers to methods or devices that prevent pregnancy. Hormonal methods  Contraceptive implant A contraceptive implant is a thin, plastic tube that contains a hormone that prevents pregnancy. It is different from an intrauterine device (IUD). It is inserted into the upper part of the arm by a health care provider. Implants can be effective for up to 3 years. Progestin-only injections Progestin-only injections are injections of progestin, a synthetic form of the hormone progesterone. They are given every 3 months by a health care provider. Birth control pills Birth control pills are pills that contain hormones that prevent pregnancy. They must be taken once a day, preferably at the same time each day. A prescription is needed to use this method of contraception. Birth control patch The birth control patch contains hormones that prevent pregnancy. It is placed on the skin and must be changed once a week for three weeks and removed on the fourth week. A prescription is needed to use this method of contraception. Vaginal ring A vaginal ring contains hormones that prevent pregnancy. It is placed in the vagina for three weeks and removed on the fourth week. After that, the process is repeated with a new ring. A prescription is needed to use this method of contraception. Emergency contraceptive Emergency contraceptives prevent pregnancy after unprotected sex. They come in pill form and can be taken up to 5 days after sex. They work best the sooner they are taken after having sex. Most emergency contraceptives are available without a prescription. This method should not be used as your only form of birth control. Barrier methods  Female condom A female condom is a thin sheath that is worn over the penis during sex. Condoms keep sperm from going inside a woman's body. They can be used with a sperm-killing substance (spermicide) to increase their  effectiveness. They should be thrown away after one use. Female condom A female condom is a soft, loose-fitting sheath that is put into the vagina before sex. The condom keeps sperm from going inside a woman's body. They should be thrown away after one use. Diaphragm A diaphragm is a soft, dome-shaped barrier. It is inserted into the vagina before sex, along with a spermicide. The diaphragm blocks sperm from entering the uterus, and the spermicide kills sperm. A diaphragm should be left in the vagina for 6-8 hours after sex and removed within 24 hours. A diaphragm is prescribed and fitted by a health care provider. A diaphragm should be replaced every 1-2 years, after giving birth, after gaining more than 15 lb (6.8 kg), and after pelvic surgery. Cervical cap A cervical cap is a round, soft latex or plastic cup that fits over the cervix. It is inserted into the vagina before sex, along with spermicide. It blocks sperm from entering the uterus. The cap should be left in place for 6-8 hours after sex and removed within 48 hours. A cervical cap must be prescribed and fitted by a health care provider. It should be replaced every 2 years. Sponge A sponge is a soft, circular piece of polyurethane foam with spermicide in it. The sponge helps block sperm from entering the uterus, and the spermicide kills sperm. To use it, you make it wet and then insert it into the vagina. It should be inserted before sex, left in for at least 6 hours after sex, and removed and thrown away within 30 hours. Spermicides Spermicides are chemicals that kill or block sperm from entering the cervix  and uterus. They can come as a cream, jelly, suppository, foam, or tablet. A spermicide should be inserted into the vagina with an applicator at least 11-57 minutes before sex to allow time for it to work. The process must be repeated every time you have sex. Spermicides do not require a prescription. Intrauterine  contraception Intrauterine device (IUD) An IUD is a T-shaped device that is put in a woman's uterus. There are two types: Hormone IUD.This type contains progestin, a synthetic form of the hormone progesterone. This type can stay in place for 3-5 years. Copper IUD.This type is wrapped in copper wire. It can stay in place for 10 years. Permanent methods of contraception Female tubal ligation In this method, a woman's fallopian tubes are sealed, tied, or blocked during surgery to prevent eggs from traveling to the uterus. Hysteroscopic sterilization In this method, a small, flexible insert is placed into each fallopian tube. The inserts cause scar tissue to form in the fallopian tubes and block them, so sperm cannot reach an egg. The procedure takes about 3 months to be effective. Another form of birth control must be used during those 3 months. Female sterilization This is a procedure to tie off the tubes that carry sperm (vasectomy). After the procedure, the man can still ejaculate fluid (semen). Another form of birth control must be used for 3 months after the procedure. Natural planning methods Natural family planning In this method, a couple does not have sex on days when the woman could become pregnant. Calendar method In this method, the woman keeps track of the length of each menstrual cycle, identifies the days when pregnancy can happen, and does not have sex on those days. Ovulation method In this method, a couple avoids sex during ovulation. Symptothermal method This method involves not having sex during ovulation. The woman typically checks for ovulation by watching changes in her temperature and in the consistency of cervical mucus. Post-ovulation method In this method, a couple waits to have sex until after ovulation. Where to find more information Centers for Disease Control and Prevention: http://www.wolf.info/ Summary Contraception, also called birth control, refers to methods or devices  that prevent pregnancy. Hormonal methods of contraception include implants, injections, pills, patches, vaginal rings, and emergency contraceptives. Barrier methods of contraception can include female condoms, female condoms, diaphragms, cervical caps, sponges, and spermicides. There are two types of IUDs (intrauterine devices). An IUD can be put in a woman's uterus to prevent pregnancy for 3-5 years. Permanent sterilization can be done through a procedure for males and females. Natural family planning methods involve nothaving sex on days when the woman could become pregnant. This information is not intended to replace advice given to you by your health care provider. Make sure you discuss any questions you have with your health care provider. Document Revised: 11/02/2019 Document Reviewed: 11/02/2019 Elsevier Patient Education  [place] 79-47 Years Old, Female Preventive care refers to lifestyle choices and visits with your health care provider that can promote health and wellness. Preventive care visits are also called wellness exams. What can I expect for my preventive care visit? Counseling During your preventive care visit, your health care provider may ask about your: Medical history, including: Past medical problems. Family medical history. Pregnancy history. Current health, including: Menstrual cycle. Method of birth control. Emotional well-being. Home life and relationship well-being. Sexual activity and sexual health. Lifestyle, including: Alcohol, nicotine or tobacco, and drug use. Access to firearms. Diet, exercise, and sleep habits.  Work and work Statistician. Sunscreen use. Safety issues such as seatbelt and bike helmet use. Physical exam Your health care provider may check your: Height and weight. These may be used to calculate your BMI (body mass index). BMI is a measurement that tells if you are at a healthy weight. Waist circumference.  This measures the distance around your waistline. This measurement also tells if you are at a healthy weight and may help predict your risk of certain diseases, such as type 2 diabetes and high blood pressure. Heart rate and blood pressure. Body temperature. Skin for abnormal spots. What immunizations do I need?  Vaccines are usually given at various ages, according to a schedule. Your health care provider will recommend vaccines for you based on your age, medical history, and lifestyle or other factors, such as travel or where you work. What tests do I need? Screening Your health care provider may recommend screening tests for certain conditions. This may include: Pelvic exam and Pap test. Lipid and cholesterol levels. Diabetes screening. This is done by checking your blood sugar (glucose) after you have not eaten for a while (fasting). Hepatitis B test. Hepatitis C test. HIV (human immunodeficiency virus) test. STI (sexually transmitted infection) testing, if you are at risk. BRCA-related cancer screening. This may be done if you have a family history of breast, ovarian, tubal, or peritoneal cancers. Talk with your health care provider about your test results, treatment options, and if necessary, the need for more tests. Follow these instructions at home: Eating and drinking  Eat a healthy diet that includes fresh fruits and vegetables, whole grains, lean protein, and low-fat dairy products. Take vitamin and mineral supplements as recommended by your health care provider. Do not drink alcohol if: Your health care provider tells you not to drink. You are pregnant, may be pregnant, or are planning to become pregnant. If you drink alcohol: Limit how much you have to 0-1 drink a day. Know how much alcohol is in your drink. In the [place] one drink equals one 12 oz bottle of beer (355 mL), one 5 oz glass of wine (148 mL), or one 1 oz glass of hard liquor (44 mL). Lifestyle Brush your teeth  every morning and night with fluoride toothpaste. Floss one time each day. Exercise for at least 30 minutes 5 or more days each week. Do not use any products that contain nicotine or tobacco. These products include cigarettes, chewing tobacco, and vaping devices, such as e-cigarettes. If you need help quitting, ask your health care provider. Do not use drugs. If you are sexually active, practice safe sex. Use a condom or other form of protection to prevent STIs. If you do not wish to become pregnant, use a form of birth control. If you plan to become pregnant, see your health care provider for a prepregnancy visit. Find healthy ways to manage stress, such as: Meditation, yoga, or listening to music. Journaling. Talking to a trusted person. Spending time with friends and family. Minimize exposure to UV radiation to reduce your risk of skin cancer. Safety Always wear your seat belt while driving or riding in a vehicle. Do not drive: If you have been drinking alcohol. Do not ride with someone who has been drinking. If you have been using any mind-altering substances or drugs. While texting. When you are tired or distracted. Wear a helmet and other protective equipment during sports activities. If you have firearms in your house, make sure you follow all gun safety procedures. Seek  help if you have been physically or sexually abused. What's next? Go to your health care provider once a year for an annual wellness visit. Ask your health care provider how often you should have your eyes and teeth checked. Stay up to date on all vaccines. This information is not intended to replace advice given to you by your health care provider. Make sure you discuss any questions you have with your health care provider. Document Revised: 11/23/2020 Document Reviewed: 11/23/2020 Elsevier Patient Education  [place].

## 2022-01-05 NOTE — Progress Notes (Signed)
Subjective:     Caitlyn Hancock is a 21 y.o. female and is here for a comprehensive physical exam. The patient reports no problems.  The following portions of the patient's history were reviewed and updated as appropriate: allergies, current medications, past family history, past medical history, past social history, past surgical history, and problem list.  Review of Systems Pertinent items noted in HPI and remainder of comprehensive ROS otherwise negative.   Objective:    Ht 5\' 4"  (1.626 m)   Wt 123 lb (55.8 kg)   LMP 12/29/2021   BMI 21.11 kg/m  General appearance: alert, cooperative, and appears stated age Head: Normocephalic, without obvious abnormality, atraumatic Neck: no adenopathy, supple, symmetrical, trachea midline, and thyroid not enlarged, symmetric, no tenderness/mass/nodules Lungs: clear to auscultation bilaterally Breasts: normal appearance, no masses or tenderness Heart: regular rate and rhythm, S1, S2 normal, no murmur, click, rub or gallop Abdomen: soft, non-tender; bowel sounds normal; no masses,  no organomegaly Pelvic: cervix normal in appearance, external genitalia normal, no adnexal masses or tenderness, no cervical motion tenderness, uterus normal size, shape, and consistency, and vagina normal without discharge Extremities: extremities normal, atraumatic, no cyanosis or edema Pulses: 2+ and symmetric Skin: Skin color, texture, turgor normal. No rashes or lesions Lymph nodes: Cervical, supraclavicular, and axillary nodes normal. Neurologic: Grossly normal    Assessment:    Healthy female exam.      Plan:  Encounter for gynecological examination with abnormal finding  Screening examination for STD (sexually transmitted disease)  Screening for malignant neoplasm of cervix - Plan: Cytology - PAP( [facility])   Return in 1 year (on 01/06/2023).  See After Visit Summary for Counseling Recommendations

## 2022-01-09 LAB — CYTOLOGY - PAP
Chlamydia: NEGATIVE
Comment: NEGATIVE
Comment: NEGATIVE
Comment: NORMAL
Diagnosis: NEGATIVE
Neisseria Gonorrhea: NEGATIVE
Trichomonas: NEGATIVE

## 2022-01-20 ENCOUNTER — Emergency Department (HOSPITAL_BASED_OUTPATIENT_CLINIC_OR_DEPARTMENT_OTHER)
Admission: EM | Admit: 2022-01-20 | Discharge: 2022-01-20 | Disposition: A | Discharge status: Home / Self Care | Payer: Medicaid Other | Attending: Emergency Medicine | Admitting: Emergency Medicine

## 2022-01-20 ENCOUNTER — Other Ambulatory Visit: Payer: Self-pay

## 2022-01-20 ENCOUNTER — Encounter (HOSPITAL_BASED_OUTPATIENT_CLINIC_OR_DEPARTMENT_OTHER): Payer: Self-pay | Admitting: Emergency Medicine

## 2022-01-20 ENCOUNTER — Emergency Department (HOSPITAL_BASED_OUTPATIENT_CLINIC_OR_DEPARTMENT_OTHER): Payer: Medicaid Other

## 2022-01-20 DIAGNOSIS — N390 Urinary tract infection, site not specified: Secondary | ICD-10-CM | POA: Diagnosis not present

## 2022-01-20 DIAGNOSIS — Z20822 Contact with and (suspected) exposure to covid-19: Secondary | ICD-10-CM | POA: Insufficient documentation

## 2022-01-20 DIAGNOSIS — R1084 Generalized abdominal pain: Secondary | ICD-10-CM | POA: Diagnosis present

## 2022-01-20 DIAGNOSIS — K529 Noninfective gastroenteritis and colitis, unspecified: Secondary | ICD-10-CM | POA: Diagnosis not present

## 2022-01-20 LAB — CBC WITH DIFFERENTIAL/PLATELET
Abs Immature Granulocytes: 0.04 10*3/uL (ref 0.00–0.07)
Basophils Absolute: 0 10*3/uL (ref 0.0–0.1)
Basophils Relative: 0 %
Eosinophils Absolute: 0 10*3/uL (ref 0.0–0.5)
Eosinophils Relative: 0 %
HCT: 36.1 % (ref 36.0–46.0)
Hemoglobin: 12.1 g/dL (ref 12.0–15.0)
Immature Granulocytes: 0 %
Lymphocytes Relative: 4 %
Lymphs Abs: 0.5 10*3/uL — ABNORMAL LOW (ref 0.7–4.0)
MCH: 29.4 pg (ref 26.0–34.0)
MCHC: 33.5 g/dL (ref 30.0–36.0)
MCV: 87.8 fL (ref 80.0–100.0)
Monocytes Absolute: 0.7 10*3/uL (ref 0.1–1.0)
Monocytes Relative: 7 %
Neutro Abs: 9.8 10*3/uL — ABNORMAL HIGH (ref 1.7–7.7)
Neutrophils Relative %: 89 %
Platelets: 201 10*3/uL (ref 150–400)
RBC: 4.11 MIL/uL (ref 3.87–5.11)
RDW: 12.3 % (ref 11.5–15.5)
WBC: 11.1 10*3/uL — ABNORMAL HIGH (ref 4.0–10.5)
nRBC: 0 % (ref 0.0–0.2)

## 2022-01-20 LAB — URINALYSIS, MICROSCOPIC (REFLEX)

## 2022-01-20 LAB — URINALYSIS, ROUTINE W REFLEX MICROSCOPIC
Bilirubin Urine: NEGATIVE
Glucose, UA: NEGATIVE mg/dL
Ketones, ur: >=80 mg/dL — AB
Leukocytes,Ua: NEGATIVE
Nitrite: NEGATIVE
Protein, ur: 30 mg/dL — AB
Specific Gravity, Urine: >=1.03 (ref 1.005–1.030)
pH: 5.5 (ref 5.0–8.0)

## 2022-01-20 LAB — RESP PANEL BY RT-PCR (FLU A&B, COVID) ARPGX2
Influenza A by PCR: NEGATIVE
Influenza B by PCR: NEGATIVE
SARS Coronavirus 2 by RT PCR: NEGATIVE

## 2022-01-20 LAB — COMPREHENSIVE METABOLIC PANEL
ALT: 19 U/L (ref 0–44)
AST: 21 U/L (ref 15–41)
Albumin: 4 g/dL (ref 3.5–5.0)
Alkaline Phosphatase: 53 U/L (ref 38–126)
Anion gap: 8 (ref 5–15)
BUN: 11 mg/dL (ref 6–20)
CO2: 22 mmol/L (ref 22–32)
Calcium: 9 mg/dL (ref 8.9–10.3)
Chloride: 101 mmol/L (ref 98–111)
Creatinine, Ser: 0.65 mg/dL (ref 0.44–1.00)
GFR, Estimated: >60 mL/min (ref >60)
Glucose, Bld: 116 mg/dL — ABNORMAL HIGH (ref 70–99)
Potassium: 4.1 mmol/L (ref 3.5–5.1)
Sodium: 131 mmol/L — ABNORMAL LOW (ref 135–145)
Total Bilirubin: 1 mg/dL (ref 0.3–1.2)
Total Protein: 7.8 g/dL (ref 6.5–8.1)

## 2022-01-20 LAB — LIPASE, BLOOD: Lipase: 23 U/L (ref 11–51)

## 2022-01-20 LAB — PREGNANCY, URINE: Preg Test, Ur: NEGATIVE

## 2022-01-20 LAB — LACTIC ACID, PLASMA: Lactic Acid, Venous: 1.3 mmol/L (ref 0.5–1.9)

## 2022-01-20 MED ORDER — SODIUM CHLORIDE 0.9 % IV SOLN
1.0000 g | Freq: Once | INTRAVENOUS | Status: AC
  Administered 2022-01-20: 1 g via INTRAVENOUS
  Filled 2022-01-20: qty 10

## 2022-01-20 MED ORDER — ONDANSETRON HCL 4 MG/2ML IJ SOLN
4.0000 mg | Freq: Once | INTRAMUSCULAR | Status: AC
  Administered 2022-01-20: 4 mg via INTRAVENOUS
  Filled 2022-01-20: qty 2

## 2022-01-20 MED ORDER — SODIUM CHLORIDE 0.9 % IV BOLUS
1000.0000 mL | Freq: Once | INTRAVENOUS | Status: AC
  Administered 2022-01-20: 1000 mL via INTRAVENOUS

## 2022-01-20 MED ORDER — IOHEXOL 300 MG/ML  SOLN
75.0000 mL | Freq: Once | INTRAMUSCULAR | Status: AC | PRN
  Administered 2022-01-20: 75 mL via INTRAVENOUS

## 2022-01-20 MED ORDER — ACETAMINOPHEN 325 MG PO TABS
650.0000 mg | ORAL_TABLET | Freq: Once | ORAL | Status: AC
  Administered 2022-01-20: 650 mg via ORAL
  Filled 2022-01-20: qty 2

## 2022-01-20 MED ORDER — CEPHALEXIN 500 MG PO CAPS: 500.0000 mg | ORAL_CAPSULE | Freq: Four times a day (QID) | ORAL | 0 refills | Status: AC

## 2022-01-20 MED ORDER — ONDANSETRON 4 MG PO TBDP
4.0000 mg | ORAL_TABLET | Freq: Three times a day (TID) | ORAL | 0 refills | Status: AC | PRN
Start: 2022-01-20 — End: ?

## 2022-01-20 NOTE — ED Triage Notes (Signed)
Pt states she started running a fever the night before last and yesterday she started having abd pain that radiates into her back  Pt states she has not been able to have a BM for the past three days and has had nausea without vomiting  Pt states she has not been able to eat but has been able to drink fluids   Pt states she has taken two laxatives without results  Pt has been taking advil for her fevers, last dose was at 8pm

## 2022-01-20 NOTE — ED Provider Notes (Signed)
[facility] EMERGENCY DEPARTMENT Provider Note   CSN: [id number] Arrival date & time: 01/20/22  0215     History  Chief Complaint  Patient presents with   Fever   Abdominal Pain    Caitlyn Hancock is a 21 y.o. female.  The history is provided by the patient and medical records.  Fever Abdominal Pain Associated symptoms: fever   Caitlyn Hancock is a 21 y.o. female who presents to the Emergency Department complaining of abdominal pain fever.  She presents to the ED for evaluation of generalized abdominal pain and bilateral back pain for two days with associated fever.  Fever to 103.  Has body aches.  Has constipation.  Took laxatives without relief.  Has nausea, no vomiting.  No dysuria, vaginal discharge.    LMP three weeks ago.   No prior similar sxs.  No known medical problems.  No prior abdominal surgeries.  No new sexual partners.     Home Medications Prior to Admission medications   Medication Sig Start Date End Date Taking? Authorizing Provider  cephALEXin (KEFLEX) 500 MG capsule Take 1 capsule (500 mg total) by mouth 4 (four) times daily. 01/20/22  Yes Tilden Fossa, MD  ondansetron (ZOFRAN-ODT) 4 MG disintegrating tablet Take 1 tablet (4 mg total) by mouth every 8 (eight) hours as needed for nausea or vomiting. 01/20/22  Yes Tilden Fossa, MD  hydrOXYzine (ATARAX) 25 MG tablet Take by mouth.    [provider]  omeprazole (PRILOSEC) 40 MG capsule Take 40 mg by mouth daily. 12/04/21   [provider]      Allergies    Patient has no known allergies.    Review of Systems   Review of Systems  Constitutional:  Positive for fever.  Gastrointestinal:  Positive for abdominal pain.  All other systems reviewed and are negative.   Physical Exam Updated Vital Signs BP 123/76   Pulse (!) 136   Temp (!) 102.7 F (39.3 C) (Oral)   Resp 18   Ht 5\' 5"  (1.651 m)   Wt 55.8 kg   LMP 12/29/2021   SpO2 100%   BMI 20.47 kg/m  Physical  Exam Vitals and nursing note reviewed.  Constitutional:      Appearance: She is well-developed.  HENT:     Head: Normocephalic and atraumatic.  Cardiovascular:     Rate and Rhythm: Regular rhythm. Tachycardia present.     Heart sounds: No murmur heard. Pulmonary:     Effort: Pulmonary effort is normal. No respiratory distress.     Breath sounds: Normal breath sounds.  Abdominal:     Palpations: Abdomen is soft.     Tenderness: There is no guarding or rebound.     Comments: Mild generalized abdominal tenderness, greatest over the central abdomen.   Musculoskeletal:        General: No tenderness.  Skin:    General: Skin is warm and dry.  Neurological:     Mental Status: She is alert and oriented to person, place, and time.  Psychiatric:        Behavior: Behavior normal.     ED Results / Procedures / Treatments   Labs (all labs ordered are listed, but only abnormal results are displayed) Labs Reviewed  COMPREHENSIVE METABOLIC PANEL - Abnormal; Notable for the following components:      Result Value   Sodium 131 (*)    Glucose, Bld 116 (*)    All other components within normal limits  CBC WITH DIFFERENTIAL/PLATELET -  Abnormal; Notable for the following components:   WBC 11.1 (*)    Neutro Abs 9.8 (*)    Lymphs Abs 0.5 (*)    All other components within normal limits  URINALYSIS, ROUTINE W REFLEX MICROSCOPIC - Abnormal; Notable for the following components:   APPearance HAZY (*)    Hgb urine dipstick MODERATE (*)    Ketones, ur >=80 (*)    Protein, ur 30 (*)    All other components within normal limits  URINALYSIS, MICROSCOPIC (REFLEX) - Abnormal; Notable for the following components:   Bacteria, UA MANY (*)    All other components within normal limits  RESP PANEL BY RT-PCR (FLU A&B, COVID) [id number]  URINE CULTURE  LACTIC ACID, PLASMA  PREGNANCY, URINE  LIPASE, BLOOD  LACTIC ACID, PLASMA    EKG None  Radiology CT Abdomen Pelvis W Contrast  Result Date:  01/20/2022 CLINICAL DATA:  Abdominal pain that radiates to the back. EXAM: CT ABDOMEN AND PELVIS WITH CONTRAST TECHNIQUE: Multidetector CT imaging of the abdomen and pelvis was performed using the standard protocol following bolus administration of intravenous contrast. RADIATION DOSE REDUCTION: This exam was performed according to the departmental dose-optimization program which includes automated exposure control, adjustment of the mA and/or kV according to patient size and/or use of iterative reconstruction technique. CONTRAST:  59mL OMNIPAQUE IOHEXOL 300 MG/ML  SOLN COMPARISON:  June 02, 2021 FINDINGS: Lower chest: No acute abnormality. Hepatobiliary: No focal liver abnormality is seen. No gallstones, gallbladder wall thickening, or biliary dilatation. Pancreas: Unremarkable. No pancreatic ductal dilatation or surrounding inflammatory changes. Spleen: Normal in size without focal abnormality. Adrenals/Urinary Tract: Adrenal glands are unremarkable. Kidneys are normal, without renal calculi, focal lesion, or hydronephrosis. The urinary bladder is poorly distended and subsequently limited in evaluation. Stomach/Bowel: Stomach is within normal limits. Appendix appears normal. Stool is seen throughout the large bowel. No evidence of bowel dilatation. The distal and terminal is moderately thickened and inflamed (axial CT images 51 through 62, CT series 2). Vascular/Lymphatic: No significant vascular findings are present. No enlarged abdominal or pelvic lymph nodes. Reproductive: Uterus and bilateral adnexa are unremarkable. Other: No abdominal wall hernia or abnormality. A small amount of posterior pelvic free fluid is seen. Musculoskeletal: No acute or significant osseous findings. IMPRESSION: 1. Moderate severity distal and terminal ileitis. 2. Small amount of posterior pelvic free fluid, likely physiologic. Electronically Signed   By: Aram Candela M.D.   On: 01/20/2022 04:14    Procedures Procedures     Medications Ordered in ED Medications  ondansetron (ZOFRAN) injection 4 mg (4 mg Intravenous Given 01/20/22 0321)  sodium chloride 0.9 % bolus 1,000 mL ( Intravenous Stopped 01/20/22 0442)  acetaminophen (TYLENOL) tablet 650 mg (650 mg Oral Given 01/20/22 0321)  iohexol (OMNIPAQUE) 300 MG/ML solution 75 mL (75 mLs Intravenous Contrast Given 01/20/22 0343)  cefTRIAXone (ROCEPHIN) 1 g in sodium chloride 0.9 % 100 mL IVPB (0 g Intravenous Stopped 01/20/22 0433)    ED Course/ Medical Decision Making/ A&P                           Medical Decision Making Amount and/or Complexity of Data Reviewed Labs: ordered. Radiology: ordered.  Risk OTC drugs. Prescription drug management.   Patient here for evaluation of fevers, nausea, abdominal and back pain.  She is tachycardic, febrile on ED arrival.  She was treated with IV fluids, antipyretic.  Labs significant for mild hyponatremia, mild leukocytosis.  UA is consistent  with UTI and she was treated with antibiotics.  We will send a urine culture.  Given her symptoms a CT abdomen pelvis was obtained.  CT is negative for acute appendicitis but does demonstrate ileitis.  On repeat evaluation she is feeling improved, able to tolerate oral fluids.  Please note the patient's heart rate was improved at time of discharge and around 110.  No hypotension during her ED stay.  At this point feel she is stable for discharge home on oral antibiotics with close return precautions.  Will refer to GI given her CT scan findings.        Final Clinical Impression(s) / ED Diagnoses Final diagnoses:  Acute UTI  Ileitis    Rx / DC Orders ED Discharge Orders          Ordered    cephALEXin (KEFLEX) 500 MG capsule  4 times daily        01/20/22 0438    ondansetron (ZOFRAN-ODT) 4 MG disintegrating tablet  Every 8 hours PRN        01/20/22 0438              Tilden Fossa, MD 01/20/22 [phone]

## 2022-01-20 NOTE — ED Notes (Signed)
Pt given water per po challenge

## 2022-01-21 ENCOUNTER — Encounter (HOSPITAL_BASED_OUTPATIENT_CLINIC_OR_DEPARTMENT_OTHER): Payer: Self-pay | Admitting: Emergency Medicine

## 2022-01-21 ENCOUNTER — Other Ambulatory Visit: Payer: Self-pay

## 2022-01-21 ENCOUNTER — Emergency Department (HOSPITAL_BASED_OUTPATIENT_CLINIC_OR_DEPARTMENT_OTHER)
Admission: EM | Admit: 2022-01-21 | Discharge: 2022-01-21 | Disposition: A | Discharge status: Home / Self Care | Payer: Medicaid Other | Attending: Emergency Medicine | Admitting: Emergency Medicine

## 2022-01-21 DIAGNOSIS — K529 Noninfective gastroenteritis and colitis, unspecified: Secondary | ICD-10-CM | POA: Insufficient documentation

## 2022-01-21 DIAGNOSIS — R109 Unspecified abdominal pain: Secondary | ICD-10-CM | POA: Diagnosis present

## 2022-01-21 LAB — URINE CULTURE: Culture: <10000 — AB

## 2022-01-21 LAB — COMPREHENSIVE METABOLIC PANEL
ALT: 14 U/L (ref 0–44)
AST: 18 U/L (ref 15–41)
Albumin: 3.7 g/dL (ref 3.5–5.0)
Alkaline Phosphatase: 46 U/L (ref 38–126)
Anion gap: 8 (ref 5–15)
BUN: 10 mg/dL (ref 6–20)
CO2: 25 mmol/L (ref 22–32)
Calcium: 8.9 mg/dL (ref 8.9–10.3)
Chloride: 102 mmol/L (ref 98–111)
Creatinine, Ser: 0.53 mg/dL (ref 0.44–1.00)
GFR, Estimated: >60 mL/min (ref >60)
Glucose, Bld: 84 mg/dL (ref 70–99)
Potassium: 3.9 mmol/L (ref 3.5–5.1)
Sodium: 135 mmol/L (ref 135–145)
Total Bilirubin: 0.6 mg/dL (ref 0.3–1.2)
Total Protein: 7.5 g/dL (ref 6.5–8.1)

## 2022-01-21 LAB — CBC WITH DIFFERENTIAL/PLATELET
Abs Immature Granulocytes: 0.01 10*3/uL (ref 0.00–0.07)
Basophils Absolute: 0 10*3/uL (ref 0.0–0.1)
Basophils Relative: 0 %
Eosinophils Absolute: 0 10*3/uL (ref 0.0–0.5)
Eosinophils Relative: 1 %
HCT: 34.1 % — ABNORMAL LOW (ref 36.0–46.0)
Hemoglobin: 11.6 g/dL — ABNORMAL LOW (ref 12.0–15.0)
Immature Granulocytes: 0 %
Lymphocytes Relative: 19 %
Lymphs Abs: 1.2 10*3/uL (ref 0.7–4.0)
MCH: 30 pg (ref 26.0–34.0)
MCHC: 34 g/dL (ref 30.0–36.0)
MCV: 88.1 fL (ref 80.0–100.0)
Monocytes Absolute: 0.6 10*3/uL (ref 0.1–1.0)
Monocytes Relative: 9 %
Neutro Abs: 4.3 10*3/uL (ref 1.7–7.7)
Neutrophils Relative %: 71 %
Platelets: 207 10*3/uL (ref 150–400)
RBC: 3.87 MIL/uL (ref 3.87–5.11)
RDW: 12.3 % (ref 11.5–15.5)
WBC: 6.1 10*3/uL (ref 4.0–10.5)
nRBC: 0 % (ref 0.0–0.2)

## 2022-01-21 LAB — LIPASE, BLOOD: Lipase: 29 U/L (ref 11–51)

## 2022-01-21 MED ORDER — OXYCODONE HCL 5 MG PO TABS: 5.0000 mg | ORAL_TABLET | ORAL | 0 refills | Status: AC | PRN

## 2022-01-21 MED ORDER — HYDROCODONE-ACETAMINOPHEN 5-325 MG PO TABS
1.0000 | ORAL_TABLET | Freq: Once | ORAL | Status: AC
  Administered 2022-01-21: 1 via ORAL
  Filled 2022-01-21: qty 1

## 2022-01-21 MED ORDER — ONDANSETRON 4 MG PO TBDP
8.0000 mg | ORAL_TABLET | Freq: Once | ORAL | Status: AC
Start: 2022-01-21 — End: 2022-01-21
  Administered 2022-01-21: 8 mg via ORAL
  Filled 2022-01-21: qty 2

## 2022-01-21 MED ORDER — SODIUM CHLORIDE 0.9 % IV BOLUS: 500.0000 mL | Freq: Once | INTRAVENOUS | Status: DC

## 2022-01-21 MED ORDER — ONDANSETRON HCL 4 MG/2ML IJ SOLN
4.0000 mg | Freq: Once | INTRAMUSCULAR | Status: DC
  Filled 2022-01-21: qty 2

## 2022-01-21 MED ORDER — MORPHINE SULFATE (PF) 4 MG/ML IV SOLN
4.0000 mg | Freq: Once | INTRAVENOUS | Status: DC
  Filled 2022-01-21: qty 1

## 2022-01-21 NOTE — ED Notes (Signed)
Patient refused iv fluid ,iv zofran and morphine . Request oral medication Patient is currently doing po challenge

## 2022-01-21 NOTE — Discharge Instructions (Addendum)
Note the work-up today was overall reassuring given resolution of fever and decreasing white count.  Pain medication prescribed.  Note that this medication can cause her to be constipated.  Continue to drink plenty of fluids so as to avoid this.  Also consider an oral fiber supplement with possible MiraLAX if you begin to have regular bowel movements.  Consider a liquid diet as this does not exacerbate your abdominal pain.  Instructions on said diet attached your disposition papers.  An ambulatory referral was sent in for gastroenterology.  They should call you within the next 1 to 3 days to set up an appointment.  Call them tomorrow morning at your leisure convenience that way we can again touch with them from both sides.  Please do not hesitate to return to the emergency department for worrisome signs and symptoms we discussed become apparent.

## 2022-01-21 NOTE — ED Notes (Addendum)
Patient states she started her medication that was prescribed for her uti but has not taken mediation for nausea states that the abdominal pain is getting worse . Alert x4. Last BM yesterday. Denies any hematuria or dysuria. States that she  is having some nausea ,denies any vomiting

## 2022-01-21 NOTE — ED Provider Notes (Signed)
[facility] EMERGENCY DEPARTMENT Provider Note   CSN: [id number] Arrival date & time: 01/21/22  1418     History  Chief Complaint  Patient presents with   Abdominal Pain    Caitlyn Hancock is a 21 y.o. female.   Abdominal Pain   21 year old female presents emergency department with complaints of abdominal pain.  She states abdominal pain began approximately 4 days ago.  It is described as sharp in nature and is worsened with food.  She has some radiation to her left-sided back.  She notes some associated nausea but no vomiting or change in bowel habits, hematochezia, melena, urinary/vaginal symptoms.  She was recently seen in the emergency department yesterday yesterday with work-up consistent for urinary tract infection and moderate severity distal and terminal ileitis.  She was treated in the ED with 1 dose of Rocephin and was discharged with Keflex as well as Zofran.  She was given strict instructions to follow-up with GI outpatient.  She gave GI call yesterday but their office was closed prompting her visit to the emergency department.  She is requesting something for pain.  She states she was febrile yesterday but denies having fever since her hospital discharge.  She states that abdominal pain is slightly increased compared to the level at her discharge yesterday.  She states that she denies known family history of Crohn's disease.  Past medical history significant for IBS, anxiety  Home Medications Prior to Admission medications   Medication Sig Start Date End Date Taking? Authorizing Provider  oxyCODONE (ROXICODONE) 5 MG immediate release tablet Take 1 tablet (5 mg total) by mouth every 4 (four) hours as needed for severe pain. 01/21/22  Yes Sherian Maroon A, PA  cephALEXin (KEFLEX) 500 MG capsule Take 1 capsule (500 mg total) by mouth 4 (four) times daily. 01/20/22   Tilden Fossa, MD  hydrOXYzine (ATARAX) 25 MG tablet Take by mouth.    [provider]   omeprazole (PRILOSEC) 40 MG capsule Take 40 mg by mouth daily. 12/04/21   [provider]  ondansetron (ZOFRAN-ODT) 4 MG disintegrating tablet Take 1 tablet (4 mg total) by mouth every 8 (eight) hours as needed for nausea or vomiting. 01/20/22   Tilden Fossa, MD      Allergies    Patient has no known allergies.    Review of Systems   Review of Systems  Gastrointestinal:  Positive for abdominal pain.  All other systems reviewed and are negative.   Physical Exam Updated Vital Signs BP 111/76   Pulse 96   Temp 98.6 F (37 C) (Oral)   Resp 17   Wt 55.8 kg   LMP 12/29/2021   SpO2 100%   BMI 20.47 kg/m  Physical Exam Vitals and nursing note reviewed.  Constitutional:      General: She is not in acute distress.    Appearance: She is well-developed.  HENT:     Head: Normocephalic and atraumatic.  Eyes:     Conjunctiva/sclera: Conjunctivae normal.  Cardiovascular:     Rate and Rhythm: Normal rate and regular rhythm.     Heart sounds: No murmur heard. Pulmonary:     Effort: Pulmonary effort is normal. No respiratory distress.     Breath sounds: Normal breath sounds.  Abdominal:     General: Abdomen is flat.     Palpations: Abdomen is soft.     Tenderness: There is abdominal tenderness in the periumbilical area. There is no right CVA tenderness, left CVA tenderness or  guarding.  Musculoskeletal:        General: No swelling.     Cervical back: Neck supple.  Skin:    General: Skin is warm and dry.     Capillary Refill: Capillary refill takes less than 2 seconds.  Neurological:     Mental Status: She is alert.  Psychiatric:        Mood and Affect: Mood normal.     ED Results / Procedures / Treatments   Labs (all labs ordered are listed, but only abnormal results are displayed) Labs Reviewed  CBC WITH DIFFERENTIAL/PLATELET - Abnormal; Notable for the following components:      Result Value   Hemoglobin 11.6 (*)    HCT 34.1 (*)    All other components  within normal limits  LIPASE, BLOOD  COMPREHENSIVE METABOLIC PANEL    EKG None  Radiology CT Abdomen Pelvis W Contrast  Result Date: 01/20/2022 CLINICAL DATA:  Abdominal pain that radiates to the back. EXAM: CT ABDOMEN AND PELVIS WITH CONTRAST TECHNIQUE: Multidetector CT imaging of the abdomen and pelvis was performed using the standard protocol following bolus administration of intravenous contrast. RADIATION DOSE REDUCTION: This exam was performed according to the departmental dose-optimization program which includes automated exposure control, adjustment of the mA and/or kV according to patient size and/or use of iterative reconstruction technique. CONTRAST:  84mL OMNIPAQUE IOHEXOL 300 MG/ML  SOLN COMPARISON:  June 02, 2021 FINDINGS: Lower chest: No acute abnormality. Hepatobiliary: No focal liver abnormality is seen. No gallstones, gallbladder wall thickening, or biliary dilatation. Pancreas: Unremarkable. No pancreatic ductal dilatation or surrounding inflammatory changes. Spleen: Normal in size without focal abnormality. Adrenals/Urinary Tract: Adrenal glands are unremarkable. Kidneys are normal, without renal calculi, focal lesion, or hydronephrosis. The urinary bladder is poorly distended and subsequently limited in evaluation. Stomach/Bowel: Stomach is within normal limits. Appendix appears normal. Stool is seen throughout the large bowel. No evidence of bowel dilatation. The distal and terminal is moderately thickened and inflamed (axial CT images 51 through 62, CT series 2). Vascular/Lymphatic: No significant vascular findings are present. No enlarged abdominal or pelvic lymph nodes. Reproductive: Uterus and bilateral adnexa are unremarkable. Other: No abdominal wall hernia or abnormality. A small amount of posterior pelvic free fluid is seen. Musculoskeletal: No acute or significant osseous findings. IMPRESSION: 1. Moderate severity distal and terminal ileitis. 2. Small amount of posterior  pelvic free fluid, likely physiologic. Electronically Signed   By: Aram Candela M.D.   On: 01/20/2022 04:14    Procedures Procedures    Medications Ordered in ED Medications  ondansetron (ZOFRAN-ODT) disintegrating tablet 8 mg (8 mg Oral Given 01/21/22 1611)  HYDROcodone-acetaminophen (NORCO/VICODIN) 5-325 MG per tablet 1 tablet (1 tablet Oral Given 01/21/22 1611)    ED Course/ Medical Decision Making/ A&P                           Medical Decision Making Risk Prescription drug management.   This patient presents to the ED for concern of abdominal pain, this involves an extensive number of treatment options, and is a complaint that carries with it a high risk of complications and morbidity.  The differential diagnosis includes The causes of generalized abdominal pain include but are not limited to AAA, mesenteric ischemia, appendicitis, diverticulitis, DKA, gastritis, gastroenteritis, AMI, nephrolithiasis, pancreatitis, peritonitis, adrenal insufficiency,lead poisoning, iron toxicity, intestinal ischemia, constipation, UTI,SBO/LBO, splenic rupture, biliary disease, IBD, IBS, PUD, or hepatitis. Ectopic pregnancy, ovarian torsion, PID.   Co  morbidities that complicate the patient evaluation  See HPI   Additional history obtained:  Additional history obtained from EMR External records from outside source obtained and reviewed including CT abdomen pelvis from 01/20/2022 indicating moderate severity distal and terminal ileitis.   Lab Tests:  I Ordered, and personally interpreted labs.  The pertinent results include: No leukocytosis.  Mild anemia with hemoglobin 11.6 likely related to patient's menstrual cycle given no other source of bleeding.  Electrolytes without abnormality.  Renal function within normal limits.  No elevation of liver enzymes.  Lipase within normal limits.  Patient had beta-hCG performed yesterday which was negative.   Imaging Studies  ordered:  N/a   Cardiac Monitoring: / EKG:  The patient was maintained on a cardiac monitor.  I personally viewed and interpreted the cardiac monitored which showed an underlying rhythm of: Sinus rhythm   Consultations Obtained:  N/a   Problem List / ED Course / Critical interventions / Medication management  Abdominal pain I ordered medication including morphine for pain, Zofran for nausea, normal saline for rehydration.   Reevaluation of the patient after these medicines showed that the patient improved I have reviewed the patients home medicines and have made adjustments as needed   Social Determinants of Health:  Denies tobacco, illicit drug use   Test / Admission - Considered:  Based on terminal ileitis Vitals signs  within normal range and stable throughout visit. Laboratory studies significant for: See above Patient's symptoms likely secondary to known distal and terminal ileitis.  Patient able to tolerate oral liquids but states pain exacerbated with foods.  She is not been febrile, tachycardic, hypo or hypertensive.  Laboratory studies seem improved yesterday.  Repeat imaging deemed unnecessary at this time.  Patient seen most concerned about not being able to contact GI doctor as well as concern of pain control outpatient.  Further work-up in the emergency department deemed necessary at this time.  Ambulatory referral sent in for gastroenterology for close outpatient follow-up.  Patient tolerated oral challenge while in the emergency department.  She is having regular bowel movements and no concern for constipation.  Pain was controlled with oral pain meds.  She will be discharged with oral pain meds until she is able to get in with a GI doctor.  She is advised of the constipating nature of pain medication and educated to take fiber supplement/MiraLAX as needed to invoke bowel movement.  Treatment plan was discussed at length with patient and she knowledge understanding of  said plan and was agreeable.  Patient was educated to follow liquid diet as well as take medication as prescribed. Worrisome signs and symptoms were discussed with the patient, and the patient acknowledged understanding to return to the ED if noticed. Patient was stable upon discharge.         Final Clinical Impression(s) / ED Diagnoses Final diagnoses:  Ileitis    Rx / DC Orders ED Discharge Orders          Ordered    Ambulatory referral to Gastroenterology        01/21/22 1631    oxyCODONE (ROXICODONE) 5 MG immediate release tablet  Every 4 hours PRN        01/21/22 1631              Peter Garter, [place] 01/21/22 [id number]    Vanetta Mulders, MD 02/01/22 1801

## 2022-01-21 NOTE — ED Triage Notes (Signed)
Pt arrives pov, c/o abdominal pain and loss of appetite, emesis when eating. Fever has resolved.

## 2022-01-22 ENCOUNTER — Encounter: Payer: Self-pay | Admitting: Gastroenterology

## 2022-02-28 ENCOUNTER — Ambulatory Visit: Payer: Medicaid Other | Admitting: Gastroenterology

## 2023-03-29 IMAGING — US US ABDOMEN COMPLETE
1 series · 14 of 25 positions shown · non-contrast
Comparison: CT abdomen and pelvis 06/02/2021.

CLINICAL DATA: Left upper quadrant pain.

EXAM:
ABDOMEN ULTRASOUND COMPLETE

[Series 1: us abdomen complete · 0.14mm/px · 14 of 94 slices shown]
[im 1/94]
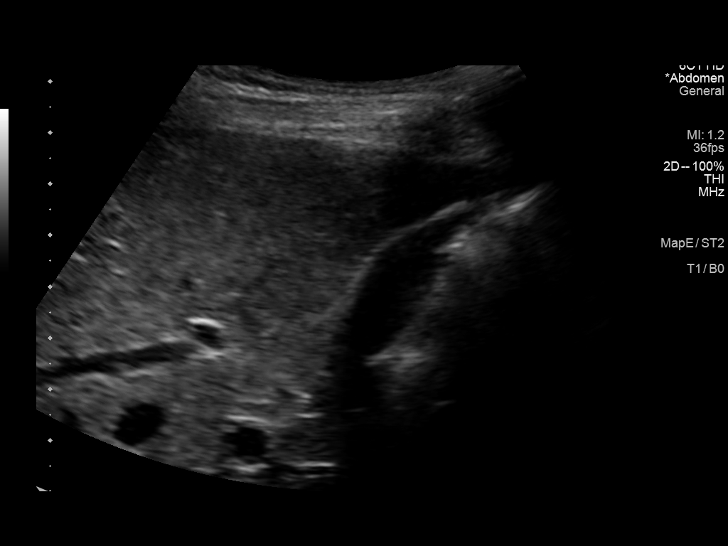
[im 8/94]
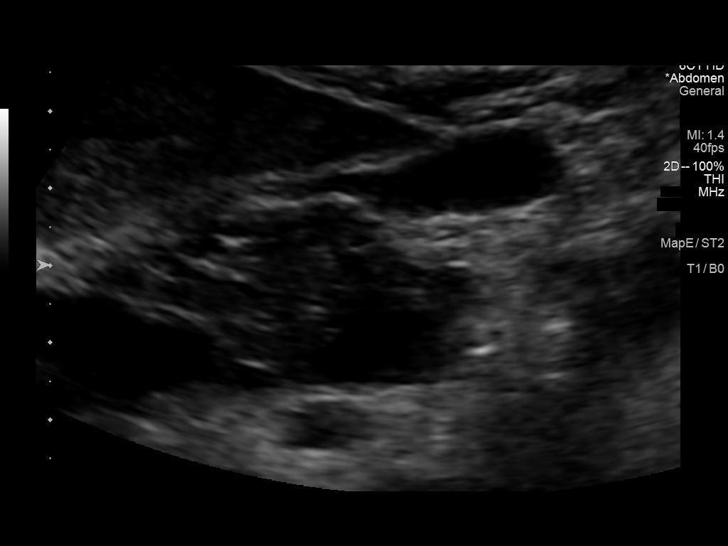
[im 16/94]
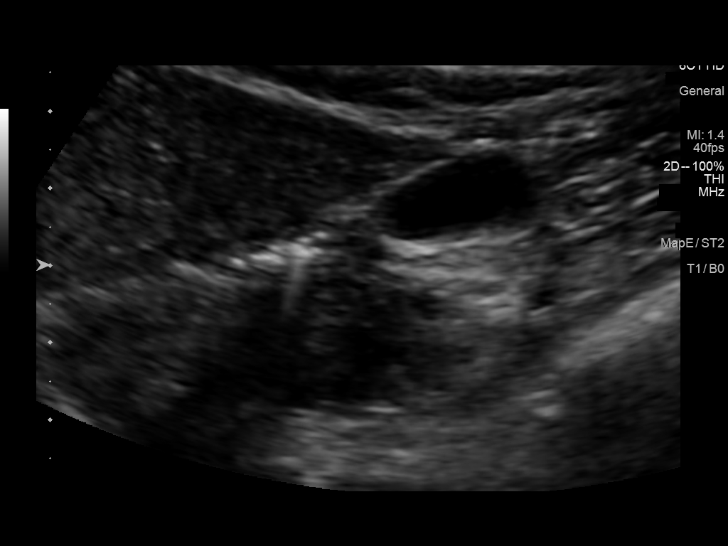
[im 24/94]
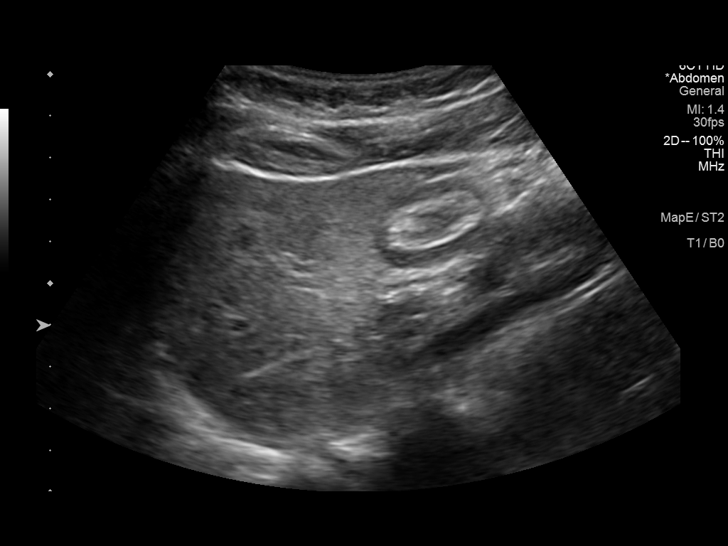
[im 32/94]
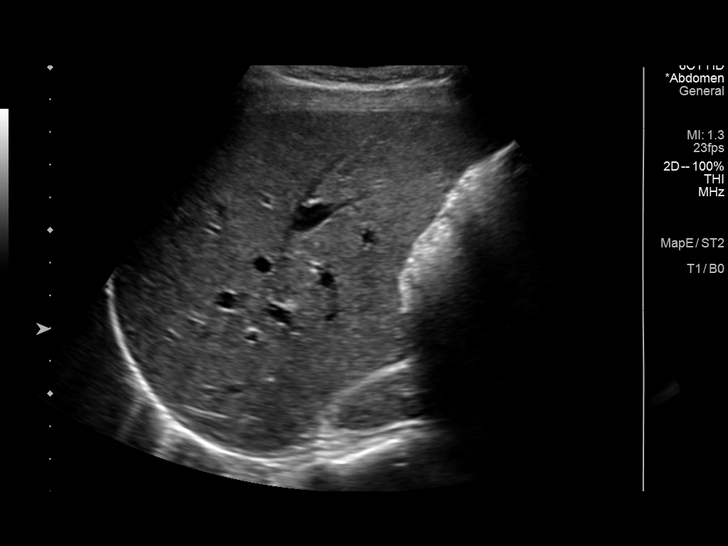
[im 35/94]
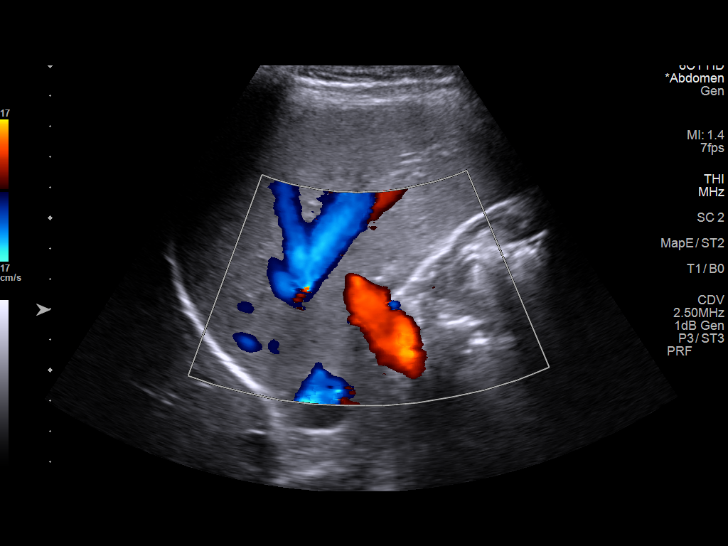
[im 43/94]
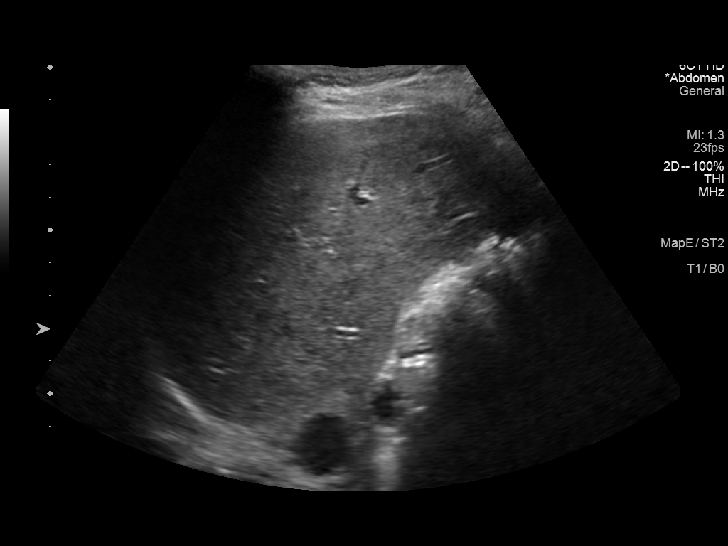
[im 51/94]
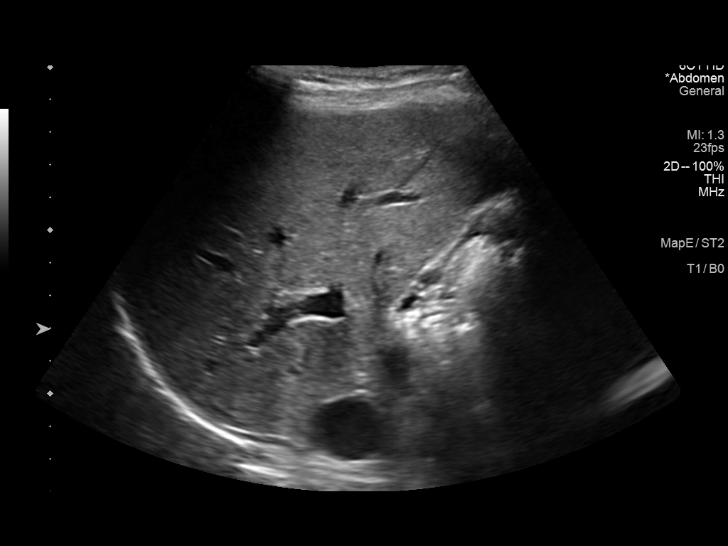
[im 59/94]
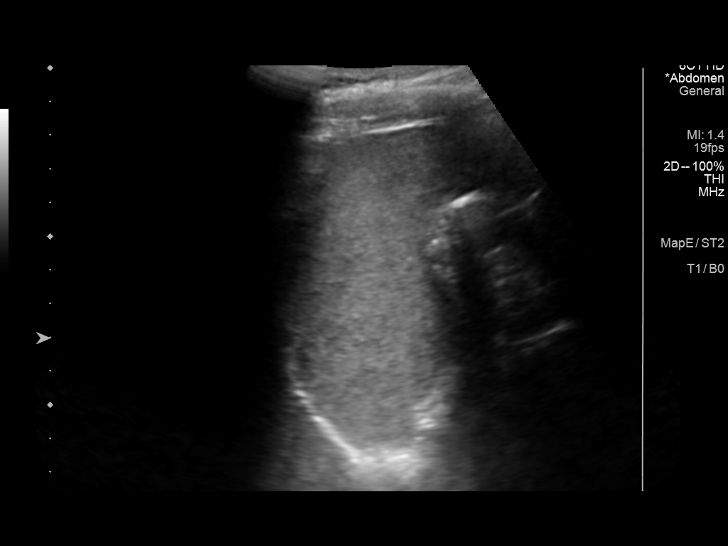
[im 63/94]
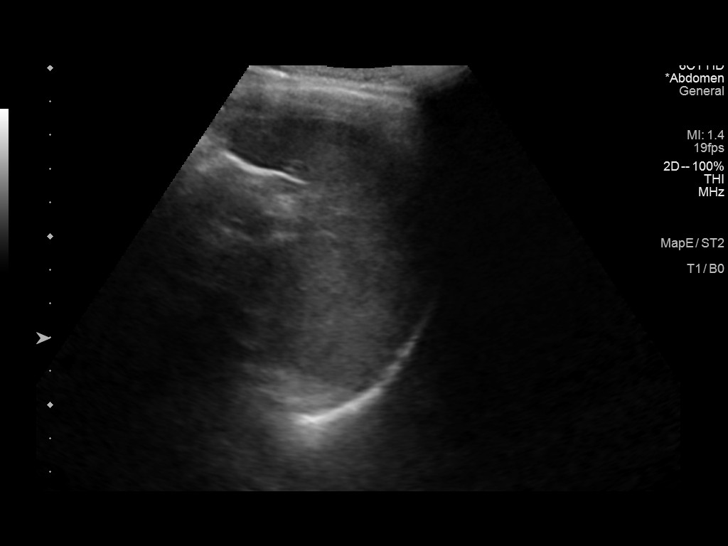
[im 70/94]
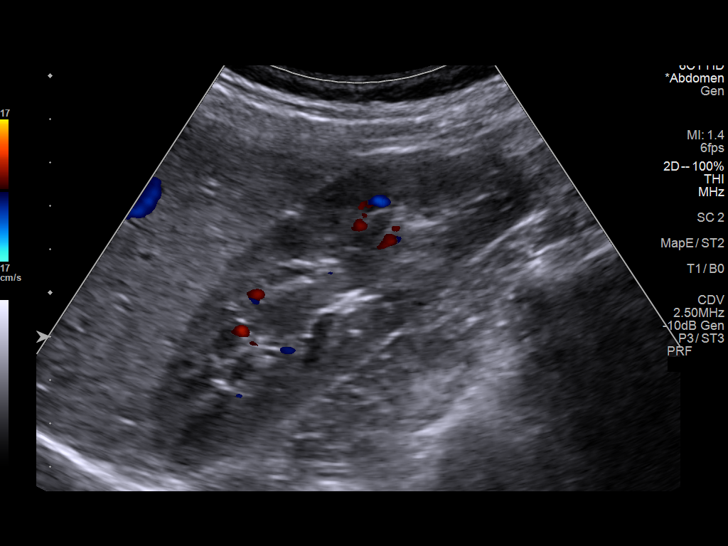
[im 78/94]
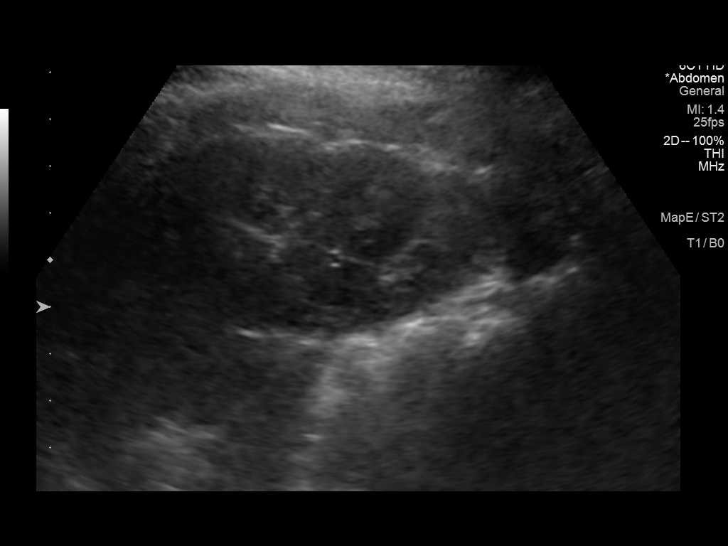
[im 86/94]
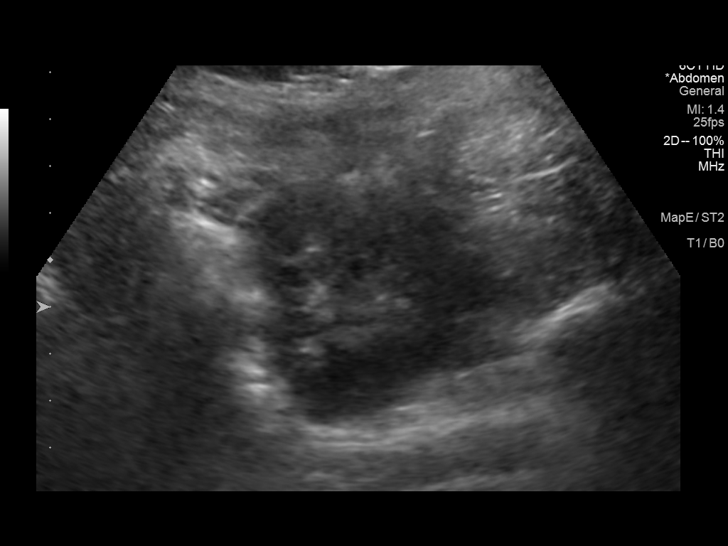
[im 94/94]
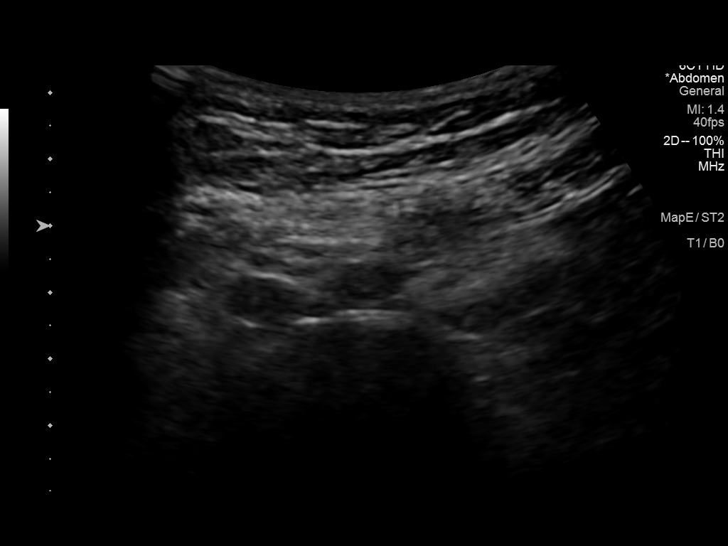

[14 of 25 positions shown; findings below may reference images not displayed]

FINDINGS: Gallbladder: No gallstones or wall thickening visualized. No
sonographic Murphy sign noted by sonographer.

Common bile duct: Diameter: 2.7 mm.

Liver: No focal lesion identified. Within normal limits in
parenchymal echogenicity. Portal vein is patent on color Doppler
imaging with normal direction of blood flow towards the liver.

IVC: No abnormality visualized.

Pancreas: Visualized portion unremarkable.

Spleen: Size and appearance within normal limits.

Right Kidney: Length: 10.8 cm. Echogenicity within normal limits. No
mass or hydronephrosis visualized.

Left Kidney: Length: 10.8 cm. Echogenicity within normal limits. No
mass or hydronephrosis visualized.

Abdominal aorta: No aneurysm visualized.  1.8 cm.

Other findings: None.
IMPRESSION: Normal abdominal ultrasound.
